# Patient Record
Sex: Male | Born: 1985 | Race: White | Hispanic: No | Marital: Single | Smoking: Current every day smoker
Health system: Southern US, Community
[De-identification: ages and names within clinical notes are randomized; demographics above are authoritative.]

## PROBLEM LIST (undated history)

## (undated) DIAGNOSIS — F419 Anxiety disorder, unspecified: Secondary | ICD-10-CM

## (undated) HISTORY — PX: HAND SURGERY: SHX662

---
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
## 2006-12-17 ENCOUNTER — Emergency Department: Payer: Self-pay | Admitting: Emergency Medicine

## 2010-03-20 ENCOUNTER — Emergency Department: Payer: Self-pay | Admitting: Emergency Medicine

## 2011-02-20 ENCOUNTER — Emergency Department: Payer: Self-pay | Admitting: Emergency Medicine

## 2011-03-18 ENCOUNTER — Emergency Department: Payer: Self-pay | Admitting: Emergency Medicine

## 2011-10-26 ENCOUNTER — Emergency Department: Payer: Self-pay | Admitting: *Deleted

## 2011-10-26 LAB — COMPREHENSIVE METABOLIC PANEL
Albumin: 4.3 g/dL (ref 3.4–5.0)
Alkaline Phosphatase: 67 U/L (ref 50–136)
BUN: 19 mg/dL — ABNORMAL HIGH (ref 7–18)
Osmolality: 291 (ref 275–301)
SGOT(AST): 27 U/L (ref 15–37)
SGPT (ALT): 28 U/L
Total Protein: 7.5 g/dL (ref 6.4–8.2)

## 2011-10-26 LAB — CBC
HCT: 46.3 % (ref 40.0–52.0)
HGB: 15.9 g/dL (ref 13.0–18.0)
MCH: 33.2 pg (ref 26.0–34.0)
Platelet: 220 10*3/uL (ref 150–440)
RBC: 4.77 10*6/uL (ref 4.40–5.90)
WBC: 7.3 10*3/uL (ref 3.8–10.6)

## 2011-10-27 LAB — URINALYSIS, COMPLETE
Bacteria: NONE SEEN
Bilirubin,UR: NEGATIVE
Blood: NEGATIVE
Glucose,UR: NEGATIVE mg/dL (ref 0–75)
Leukocyte Esterase: NEGATIVE
Nitrite: NEGATIVE
Protein: NEGATIVE
RBC,UR: <1 /HPF (ref 0–5)
Specific Gravity: 1.023 (ref 1.003–1.030)
Squamous Epithelial: NONE SEEN
WBC UR: <1 /HPF (ref 0–5)

## 2011-10-29 ENCOUNTER — Emergency Department: Payer: Self-pay | Admitting: Emergency Medicine

## 2012-06-20 ENCOUNTER — Emergency Department: Payer: Self-pay | Admitting: Emergency Medicine

## 2012-07-13 ENCOUNTER — Emergency Department: Payer: Self-pay | Admitting: Emergency Medicine

## 2012-07-13 LAB — ACETAMINOPHEN LEVEL: Acetaminophen: 16 ug/mL

## 2012-07-13 LAB — COMPREHENSIVE METABOLIC PANEL
Albumin: 4.5 g/dL (ref 3.4–5.0)
Anion Gap: 9 (ref 7–16)
Calcium, Total: 8.9 mg/dL (ref 8.5–10.1)
Chloride: 107 mmol/L (ref 98–107)
EGFR (African American): >60
Glucose: 93 mg/dL (ref 65–99)
Osmolality: 282 (ref 275–301)
Potassium: 4.1 mmol/L (ref 3.5–5.1)
SGOT(AST): 34 U/L (ref 15–37)
SGPT (ALT): 48 U/L (ref 12–78)

## 2012-07-27 ENCOUNTER — Emergency Department: Payer: Self-pay | Admitting: Emergency Medicine

## 2012-07-27 LAB — DRUG SCREEN, URINE
Barbiturates, Ur Screen: NEGATIVE (ref ?–200)
Benzodiazepine, Ur Scrn: POSITIVE (ref ?–200)
Cannabinoid 50 Ng, Ur ~~LOC~~: POSITIVE (ref ?–50)
Cocaine Metabolite,Ur ~~LOC~~: NEGATIVE (ref ?–300)
Opiate, Ur Screen: NEGATIVE (ref ?–300)
Tricyclic, Ur Screen: NEGATIVE (ref ?–1000)

## 2012-07-27 LAB — COMPREHENSIVE METABOLIC PANEL
Albumin: 4.7 g/dL (ref 3.4–5.0)
Alkaline Phosphatase: 99 U/L (ref 50–136)
Anion Gap: 11 (ref 7–16)
BUN: 30 mg/dL — ABNORMAL HIGH (ref 7–18)
Calcium, Total: 9.3 mg/dL (ref 8.5–10.1)
Chloride: 97 mmol/L — ABNORMAL LOW (ref 98–107)
Creatinine: 1.16 mg/dL (ref 0.60–1.30)
EGFR (African American): >60
EGFR (Non-African Amer.): >60
Glucose: 78 mg/dL (ref 65–99)
Osmolality: 281 (ref 275–301)
Potassium: 3.8 mmol/L (ref 3.5–5.1)
Sodium: 138 mmol/L (ref 136–145)
Total Protein: 8.5 g/dL — ABNORMAL HIGH (ref 6.4–8.2)

## 2012-07-27 LAB — CBC
HGB: 16 g/dL (ref 13.0–18.0)
MCH: 31.9 pg (ref 26.0–34.0)
RBC: 5 10*6/uL (ref 4.40–5.90)
WBC: 9.2 10*3/uL (ref 3.8–10.6)

## 2012-07-27 LAB — TSH: Thyroid Stimulating Horm: 4.57 u[IU]/mL — ABNORMAL HIGH

## 2015-03-05 ENCOUNTER — Emergency Department: Payer: Self-pay

## 2015-03-05 ENCOUNTER — Encounter: Payer: Self-pay | Admitting: Urgent Care

## 2015-03-05 ENCOUNTER — Emergency Department
Admission: EM | Admit: 2015-03-05 | Discharge: 2015-03-05 | Disposition: A | Discharge status: Home / Self Care | Payer: Self-pay | Attending: Emergency Medicine | Admitting: Emergency Medicine

## 2015-03-05 DIAGNOSIS — X58XXXA Exposure to other specified factors, initial encounter: Secondary | ICD-10-CM | POA: Insufficient documentation

## 2015-03-05 DIAGNOSIS — K029 Dental caries, unspecified: Secondary | ICD-10-CM | POA: Insufficient documentation

## 2015-03-05 DIAGNOSIS — S6000XA Contusion of unspecified finger without damage to nail, initial encounter: Secondary | ICD-10-CM

## 2015-03-05 DIAGNOSIS — Y9289 Other specified places as the place of occurrence of the external cause: Secondary | ICD-10-CM | POA: Insufficient documentation

## 2015-03-05 DIAGNOSIS — Y998 Other external cause status: Secondary | ICD-10-CM | POA: Insufficient documentation

## 2015-03-05 DIAGNOSIS — Y9389 Activity, other specified: Secondary | ICD-10-CM | POA: Insufficient documentation

## 2015-03-05 DIAGNOSIS — Z720 Tobacco use: Secondary | ICD-10-CM | POA: Insufficient documentation

## 2015-03-05 DIAGNOSIS — S60022A Contusion of left index finger without damage to nail, initial encounter: Secondary | ICD-10-CM | POA: Insufficient documentation

## 2015-03-05 HISTORY — DX: Anxiety disorder, unspecified: F41.9

## 2015-03-05 MED ORDER — LIDOCAINE VISCOUS 2 % MT SOLN
15.0000 mL | Freq: Once | OROMUCOSAL | Status: AC
  Administered 2015-03-05: 15 mL via OROMUCOSAL

## 2015-03-05 MED ORDER — IBUPROFEN 800 MG PO TABS: 800.0000 mg | ORAL_TABLET | Freq: Once | ORAL | Status: AC

## 2015-03-05 MED ORDER — OXYCODONE-ACETAMINOPHEN 5-325 MG PO TABS
ORAL_TABLET | ORAL | Status: AC
  Administered 2015-03-05: 1 via ORAL
  Filled 2015-03-05: qty 1

## 2015-03-05 MED ORDER — AMOXICILLIN 500 MG PO CAPS: 500.0000 mg | ORAL_CAPSULE | Freq: Three times a day (TID) | ORAL | Status: DC

## 2015-03-05 MED ORDER — LIDOCAINE VISCOUS 2 % MT SOLN
OROMUCOSAL | Status: AC
  Administered 2015-03-05: 15 mL via OROMUCOSAL
  Filled 2015-03-05: qty 15

## 2015-03-05 MED ORDER — OXYCODONE-ACETAMINOPHEN 5-325 MG PO TABS
1.0000 | ORAL_TABLET | Freq: Once | ORAL | Status: AC
  Administered 2015-03-05: 1 via ORAL

## 2015-03-05 MED ORDER — IBUPROFEN 800 MG PO TABS: 800.0000 mg | ORAL_TABLET | Freq: Three times a day (TID) | ORAL | Status: DC | PRN

## 2015-03-05 MED ORDER — IBUPROFEN 800 MG PO TABS
ORAL_TABLET | ORAL | Status: AC
  Filled 2015-03-05: qty 1

## 2015-03-05 MED ORDER — MAGIC MOUTHWASH W/LIDOCAINE: 5.0000 mL | Freq: Four times a day (QID) | ORAL | Status: DC

## 2015-03-05 NOTE — ED Provider Notes (Signed)
[facility] Emergency Department Provider Note ____________________________________________  Time seen: Approximately 8:36 PM  I have reviewed the triage vital signs and the nursing notes.   HISTORY  Chief Complaint Dental Pain; Hand Pain; and Weakness    HPI Paul Brooks is a 29 y.o. male patient state  Arthralgia for 3 weeks. Patient states had multiple tick bites in the past couple weeks. Today he's been having fever and chills. Patient also states she's had 3 dental abscesses. Stated x-rays confirm this when he went to the dental appointment but cannot afford to have any procedure performed secondary to lack of funds. Patient also complaining of pain to the second digit left hand secondary to picking the piece of wood 5 days ago. Patient admits to alcohol and gestation today stating he was the sterilized infection in his body . Patient rates his overall body pain as a 8/10. Denies any rash or nausea vomiting diarrhea.  Past Medical History  Diagnosis Date  . Anxiety     There are no active problems to display for this patient.   Past Surgical History  Procedure Laterality Date  . Hand surgery      Current Outpatient Rx  Name  Route  Sig  Dispense  Refill  . Alum & Mag Hydroxide-Simeth (MAGIC MOUTHWASH W/LIDOCAINE) SOLN   Oral   Take 5 mLs by mouth 4 (four) times daily.   100 mL   0   . amoxicillin (AMOXIL) 500 MG capsule   Oral   Take 1 capsule (500 mg total) by mouth 3 (three) times daily.   30 capsule   0   . ibuprofen (ADVIL,MOTRIN) 800 MG tablet   Oral   Take 1 tablet (800 mg total) by mouth every 8 (eight) hours as needed for moderate pain.   15 tablet   0     Allergies Tramadol and Doxycycline  No family history on file.  Social History History  Substance Use Topics  . Smoking status: Current Every Day Smoker  . Smokeless tobacco: Not on file  . Alcohol Use: Yes    Review of Systems Constitutional: No fever/chills.  States myalgia and arthralgia Eyes: No visual changes. ENT: No sore throat. States dental pain Cardiovascular: Denies chest pain. Respiratory: Denies shortness of breath. Gastrointestinal: No abdominal pain.  No nausea, no vomiting.  No diarrhea.  No constipation. Genitourinary: Negative for dysuria. Musculoskeletal: Negative for back pain. Skin: Negative for rash. Neurological: Negative for headaches, focal weakness or numbness. } 10-point ROS otherwise negative.  ____________________________________________   PHYSICAL EXAM:  VITAL SIGNS: ED Triage Vitals  Enc Vitals Group     BP 03/05/15 1941 122/81 mmHg     Pulse Rate 03/05/15 1941 86     Resp 03/05/15 1941 16     Temp 03/05/15 1941 97.7 F (36.5 C)     Temp Source 03/05/15 1941 Oral     SpO2 03/05/15 1941 100 %     Weight 03/05/15 1941 156 lb (70.761 kg)     Height 03/05/15 1941 5\' 5"  (1.651 m)     Head Cir --      Peak Flow --      Pain Score 03/05/15 1941 8     Pain Loc --      Pain Edu? --      Excl. in GC? --    Constitutional: Alert and oriented. Appears malaise however could be secondary to alcohol use today. Eyes: Conjunctivae are normal. PERRL. EOMI. Head:  Atraumatic. Nose: No congestion/rhinnorhea. Mouth/Throat: Mucous membranes are moist.  Oropharynx non-erythematous. Multiple dental caries in the right lower molars. Neck: No stridor.  No spinal deformity no guarding with palpation full and equal range of motion. Hematological/Lymphatic/Immunilogical: Right cervical lymphadenopathy. Cardiovascular: Normal rate, regular rhythm. Grossly normal heart sounds.  Good peripheral circulation. Respiratory: Normal respiratory effort.  No retractions. Lungs CTAB. Gastrointestinal: Soft and nontender. No distention. No abdominal bruits. No CVA tenderness. Musculoskeletal: He edema to the second digit left hand. No joint effusions. Neurologic:  Normal speech and language. No gross focal neurologic deficits are  appreciated. Speech is normal. No gait instability. Skin:  Skin is warm, dry and intact. No rash noted. Psychiatric: Mood and affect are normal. Speech and behavior are normal.  ____________________________________________   LABS (all labs ordered are listed, but only abnormal results are displayed)  Labs Reviewed - No data to display ____________________________________________  EKG   ____________________________________________  RADIOLOGY  No acute findings x-ray of the second digit left hand. ____________________________________________   PROCEDURES  Procedure(s) performed: None  Critical Care performed: No  ____________________________________________   INITIAL IMPRESSION / ASSESSMENT AND PLAN / ED COURSE  Pertinent labs & imaging results that were available during my care of the patient were reviewed by me and considered in my medical decision making (see chart for details).  Myalgia, dental pain, and left second digit contusion ____________________________________________   FINAL CLINICAL IMPRESSION(S) / ED DIAGNOSES  Final diagnoses:  Pain due to dental caries  Finger contusion, initial encounter      Joni Reining, PA-C 03/05/15 2052  Loleta Rose, MD 03/05/15 [phone]

## 2015-03-05 NOTE — ED Notes (Addendum)
Patient presents with multiple c/o. Patient states, "I think I have tick fever. I have felt bad for over 3 weeks." Also reporting 3 dental abscesses - "they did xrays, but I couldn't have anything done." Patient also with pain in the 2nd digit of his LEFT hand - reports that he hit with wood x 5 days ago. Patient took EMS in - ETOH odor appreciated by RN in triage - states, "I am not a drinker, but my family told me to drink some alcohol and it would sterilize my body from all the infection that I think that I have."

## 2015-03-05 NOTE — Discharge Instructions (Signed)
OPTIONS FOR DENTAL FOLLOW UP CARE ° °[place] [facility] and [facility] - Local Safety Net Dental Clinics °http://www.ncdhhs.gov/dph/oralhealth/services/safetynetclinics.htm °  °[facility] (336-562-3123) ° °[facility] [place] (919-933-9087) ° °[facility] ([phone] ext [phone]) ° °[facility] ([phone]) ° °[facility] ([phone]) °This clinic caters to the indigent population and is on a lottery system. °Location: °[facility], [address], [place] °Clinic Hours: °Wednesdays from 6pm - 9pm, patients seen by a lottery system. °For dates, call or go to [email or web address]/[facility]/patients/Dental-[facility] °Services: °Cleanings, fillings and simple extractions. °Payment Options: °DENTAL WORK IS FREE OF CHARGE. Bring proof of income or support. °Best way to get seen: °Arrive at 5:15 pm - this is a lottery, NOT first come/first serve, so arriving earlier will not increase your chances of being seen. °  °  °[facility] °[phone] °Select option 1 for emergencies °  °Location: °[facility], [address], [place] °Clinic Hours: °No walk-ins accepted - call the day before to schedule an appointment. °Check in times are 9:30 am and 1:30 pm. °Services: °Simple extractions, temporary fillings, pulpectomy/pulp debridement, uncomplicated abscess drainage. °Payment Options: °PAYMENT IS DUE AT THE TIME OF SERVICE.  Fee is usually $100-200, additional surgical procedures (e.g. abscess drainage) may be extra. °Cash, checks, Visa/MasterCard accepted.  Can file Medicaid if patient is covered for dental - patient should call case worker to check. °No discount for [facility] patients. °Best way to get seen: °MUST call the day before and get onto the schedule. Can usually be seen the next 1-2 days. No walk-ins accepted. °  °  °[facility] °[phone] °   °Location: °[facility], [address], [place] °Clinic Hours: °M, W, Th, F 8am or 1:30pm, Tues 9a or 1:30 - first come/first served. °Services: °Simple extractions, temporary fillings, uncomplicated abscess drainage.  You do not need to be an [place] resident. °Payment Options: °PAYMENT IS DUE AT THE TIME OF SERVICE. °Dental insurance, otherwise sliding scale - bring proof of income or support. °Depending on income and treatment needed, cost is usually $50-200. °Best way to get seen: °Arrive early as it is first come/first served. °  °  °[facility] °[phone] °  °Location: °[address] °Clinic Hours: °Mon-Thu 8a-5p °Services: °Most basic dental services including extractions and fillings. °Payment Options: °PAYMENT IS DUE AT THE TIME OF SERVICE. °Sliding scale, up to 50% off - bring proof if income or support. °Medicaid with dental option accepted. °Best way to get seen: °Call to schedule an appointment, can usually be seen within 2 weeks OR they will try to see walk-ins - show up at 8a or 2p (you may have to wait). °  °  °[facility] °919-245-2435 °[place] RESIDENTS ONLY °  °Location: °[facility], [address], [place],  [address] °Clinic Hours: By appointment only. °Monday - Thursday 8am-5pm, Friday 8am-12pm °Services: Cleanings, fillings, extractions. °Payment Options: °PAYMENT IS DUE AT THE TIME OF SERVICE. °Cash, Visa or MasterCard. Sliding scale - $30 minimum per service. °Best way to get seen: °Come in to office, complete packet and make an appointment - need proof of income °or support monies for each household member and proof of [place] residence. °Usually takes about a month to get in. °  °  °[facility] °[phone] °  °Location: °[address]   [place] °Clinic Hours: Walk-in Urgent Care Dental Services are offered Monday-Friday  mornings only. °The numbers of emergencies accepted daily is limited to the number of °providers available. °Maximum 15 - Mondays, Wednesdays & Thursdays °Maximum 10 - Tuesdays & Fridays °Services: °You do not need to be a [place] resident to be seen for a dental emergency. °Emergencies are defined as pain, swelling, abnormal bleeding, or dental trauma. Walkins will receive x-rays if needed. °NOTE: Dental cleaning is not an emergency. °Payment Options: °PAYMENT IS DUE AT THE TIME OF SERVICE. °Minimum co-pay is $40.00 for uninsured patients. °Minimum co-pay is $3.00 for Medicaid with dental coverage. °Dental Insurance is accepted and must be presented at time of visit. °Medicare does not cover dental. °Forms of payment: Cash, credit card, checks. °Best way to get seen: °If not previously registered with the clinic, walk-in dental registration begins at 7:15 am and is on a first come/first serve basis. °If previously registered with the clinic, call to make an appointment. °  °  °[facility] °[phone] °[place] RESIDENTS ONLY °  °Location: °[address], [place], [place] °Clinic Hours: °Mon-Thu 10a-2p °Services: Extractions only! °Payment Options: °FREE (donations accepted) - bring proof of income or support °Best way to get seen: °Call and schedule an appointment OR come at 8am on the 1st Monday of every month (except for holidays) when it is first come/first served. °  °  °[facility] °[phone] °  °Location: °[address], [place] °Clinic Hours: °Friday mornings °Services, Payment Options, Best way to get seen: °Call for info °

## 2015-07-11 ENCOUNTER — Emergency Department
Admission: EM | Admit: 2015-07-11 | Discharge: 2015-07-11 | Disposition: A | Discharge status: Home / Self Care | Payer: Self-pay | Attending: Emergency Medicine | Admitting: Emergency Medicine

## 2015-07-11 ENCOUNTER — Emergency Department: Payer: Self-pay

## 2015-07-11 ENCOUNTER — Encounter: Payer: Self-pay | Admitting: Emergency Medicine

## 2015-07-11 DIAGNOSIS — N508 Other specified disorders of male genital organs: Secondary | ICD-10-CM | POA: Insufficient documentation

## 2015-07-11 DIAGNOSIS — R1032 Left lower quadrant pain: Secondary | ICD-10-CM

## 2015-07-11 DIAGNOSIS — Z792 Long term (current) use of antibiotics: Secondary | ICD-10-CM | POA: Insufficient documentation

## 2015-07-11 DIAGNOSIS — L255 Unspecified contact dermatitis due to plants, except food: Secondary | ICD-10-CM | POA: Insufficient documentation

## 2015-07-11 DIAGNOSIS — Z720 Tobacco use: Secondary | ICD-10-CM | POA: Insufficient documentation

## 2015-07-11 DIAGNOSIS — N50811 Right testicular pain: Secondary | ICD-10-CM

## 2015-07-11 DIAGNOSIS — Z79899 Other long term (current) drug therapy: Secondary | ICD-10-CM | POA: Insufficient documentation

## 2015-07-11 DIAGNOSIS — R609 Edema, unspecified: Secondary | ICD-10-CM

## 2015-07-11 LAB — COMPREHENSIVE METABOLIC PANEL
ALK PHOS: 70 U/L (ref 38–126)
ALT: 31 U/L (ref 17–63)
AST: 23 U/L (ref 15–41)
Albumin: 4.6 g/dL (ref 3.5–5.0)
Anion gap: 5 (ref 5–15)
BUN: 11 mg/dL (ref 6–20)
CO2: 27 mmol/L (ref 22–32)
CREATININE: 1.04 mg/dL (ref 0.61–1.24)
Calcium: 8.9 mg/dL (ref 8.9–10.3)
Chloride: 104 mmol/L (ref 101–111)
GFR calc Af Amer: >60 mL/min (ref >60)
GFR calc non Af Amer: >60 mL/min (ref >60)
Glucose, Bld: 87 mg/dL (ref 65–99)
Potassium: 3.8 mmol/L (ref 3.5–5.1)
SODIUM: 136 mmol/L (ref 135–145)
Total Bilirubin: 0.4 mg/dL (ref 0.3–1.2)
Total Protein: 7.1 g/dL (ref 6.5–8.1)

## 2015-07-11 LAB — CBC WITH DIFFERENTIAL/PLATELET
Basophils Absolute: 0.1 10*3/uL (ref 0–0.1)
Basophils Relative: 1 %
Eosinophils Absolute: 0.2 10*3/uL (ref 0–0.7)
Eosinophils Relative: 3 %
HCT: 44.2 % (ref 40.0–52.0)
HEMOGLOBIN: 15 g/dL (ref 13.0–18.0)
LYMPHS ABS: 2.6 10*3/uL (ref 1.0–3.6)
LYMPHS PCT: 29 %
MCH: 31.4 pg (ref 26.0–34.0)
MCHC: 33.9 g/dL (ref 32.0–36.0)
MCV: 92.4 fL (ref 80.0–100.0)
Monocytes Absolute: 0.4 10*3/uL (ref 0.2–1.0)
Monocytes Relative: 5 %
NEUTROS ABS: 5.6 10*3/uL (ref 1.4–6.5)
NEUTROS PCT: 62 %
Platelets: 202 10*3/uL (ref 150–440)
RBC: 4.78 MIL/uL (ref 4.40–5.90)
RDW: 13.5 % (ref 11.5–14.5)
WBC: 8.9 10*3/uL (ref 3.8–10.6)

## 2015-07-11 LAB — LIPASE, BLOOD: Lipase: 29 U/L (ref 22–51)

## 2015-07-11 MED ORDER — CEFTRIAXONE SODIUM 250 MG IJ SOLR: 250.0000 mg | Freq: Once | INTRAMUSCULAR | Status: DC

## 2015-07-11 MED ORDER — SODIUM CHLORIDE 0.9 % IV BOLUS (SEPSIS)
1000.0000 mL | INTRAVENOUS | Status: AC
  Administered 2015-07-11: 1000 mL via INTRAVENOUS

## 2015-07-11 MED ORDER — CEFTRIAXONE SODIUM 250 MG IJ SOLR
INTRAMUSCULAR | Status: AC
  Filled 2015-07-11: qty 250

## 2015-07-11 MED ORDER — IOHEXOL 240 MG/ML SOLN
25.0000 mL | Freq: Once | INTRAMUSCULAR | Status: AC | PRN
  Administered 2015-07-11: 25 mL via ORAL
  Filled 2015-07-11: qty 25

## 2015-07-11 MED ORDER — PREDNISONE 5 MG PO TABS: ORAL_TABLET | ORAL | Status: DC

## 2015-07-11 MED ORDER — ONDANSETRON HCL 4 MG/2ML IJ SOLN
4.0000 mg | Freq: Once | INTRAMUSCULAR | Status: AC
  Administered 2015-07-11: 4 mg via INTRAVENOUS

## 2015-07-11 MED ORDER — MORPHINE SULFATE (PF) 4 MG/ML IV SOLN
4.0000 mg | Freq: Once | INTRAVENOUS | Status: AC
Start: 2015-07-11 — End: 2015-07-11
  Administered 2015-07-11: 4 mg via INTRAVENOUS

## 2015-07-11 MED ORDER — DOXYCYCLINE HYCLATE 100 MG PO CAPS: ORAL_CAPSULE | ORAL | Status: DC

## 2015-07-11 MED ORDER — IOHEXOL 300 MG/ML  SOLN
100.0000 mL | Freq: Once | INTRAMUSCULAR | Status: AC | PRN
  Administered 2015-07-11: 100 mL via INTRAVENOUS
  Filled 2015-07-11: qty 100

## 2015-07-11 NOTE — ED Notes (Signed)
Brought in via ems for pain to testicles  Right is more swollen

## 2015-07-11 NOTE — ED Provider Notes (Signed)
[facility] Emergency Department Provider Note  ____________________________________________  Time seen: Approximately 2:09 PM  I have reviewed the triage vital signs and the nursing notes.   HISTORY  Chief Complaint Testicle Pain    HPI Paul Brooks is a 29 y.o. male with a history of anxiety who presents with bilateral testicular pain and LLQ abdominal pain, worsening over about 10 days.  He states that it started gradually but within about 3 days worsened to severe pain in his lower abdomen and bilateral testicles although it is worse in the left lower quadrant of his abdomen.  It is accompanied by multiple episodes of vomiting and frequent episodes of nausea.  He states the pain is sharp and severe.  Movement makes it worse and nothing makes it better.  He denies fever/chills, chest pain, shortness of breath.  Denies any penile discharge or dysuria.  He states that he is not currently sexually active.He denies any trauma to the abdomen or genital region.  He also complains of a rash associated with poison ivy or poison sumac which he acquired "a couple of days ago".  The rashes on his hands, arms, and on the right side of his face.  It is itching and he has excoriated several lesions.   Past Medical History  Diagnosis Date  . Anxiety     There are no active problems to display for this patient.   Past Surgical History  Procedure Laterality Date  . Hand surgery      Current Outpatient Rx  Name  Route  Sig  Dispense  Refill  . Alum & Mag Hydroxide-Simeth (MAGIC MOUTHWASH W/LIDOCAINE) SOLN   Oral   Take 5 mLs by mouth 4 (four) times daily.   100 mL   0   . amoxicillin (AMOXIL) 500 MG capsule   Oral   Take 1 capsule (500 mg total) by mouth 3 (three) times daily.   30 capsule   0   . doxycycline (VIBRAMYCIN) 100 MG capsule      Take 1 capsule (100 mg) by mouth twice daily for 10 days.   20 capsule   0   . ibuprofen (ADVIL,MOTRIN) 800 MG  tablet   Oral   Take 1 tablet (800 mg total) by mouth every 8 (eight) hours as needed for moderate pain.   15 tablet   0   . predniSONE (DELTASONE) 5 MG tablet      Take 12 tabs (60 mg) PO once daily on days 1-3, 8 tabs (40 mg) daily on days 4-6, 6 tabs (30 mg) daily on days 7-9, 5 tabs (25 mg) daily on days 10-12, 4 tabs (20 mg) daily on days 13-15, 3 tabs (15 mg) daily on days 16-18, 2 tabs (10 mg) daily on days 19-21, 1 tab (5 mg) daily days 22-24.   123 tablet   0     Allergies Tramadol and Doxycycline  No family history on file.  Social History Social History  Substance Use Topics  . Smoking status: Current Every Day Smoker  . Smokeless tobacco: None  . Alcohol Use: Yes    Review of Systems Constitutional: No fever/chills Eyes: No visual changes. ENT: No sore throat. Cardiovascular: Denies chest pain. Respiratory: Denies shortness of breath. Gastrointestinal: Severe left lower quadrant and generalized lower abdominal pain, sharp, associated with nausea and vomiting . Genitourinary: Negative for dysuria.  Pain in both testicles, worse on the right Musculoskeletal: Negative for back pain. Skin: Rash on his arms,  hands, and face associated with poison ivy Neurological: Negative for headaches, focal weakness or numbness.  10-point ROS otherwise negative.  ____________________________________________   PHYSICAL EXAM:  VITAL SIGNS: ED Triage Vitals  Enc Vitals Group     BP 07/11/15 1228 113/68 mmHg     Pulse Rate 07/11/15 1228 78     Resp 07/11/15 1228 20     Temp 07/11/15 1228 98.3 F (36.8 C)     Temp Source 07/11/15 1228 Oral     SpO2 07/11/15 1228 99 %     Weight 07/11/15 1228 145 lb (65.772 kg)     Height 07/11/15 1228  (1.651 m)     Head Cir --      Peak Flow --      Pain Score 07/11/15 1229 9     Pain Loc --      Pain Edu? --      Excl. in GC? --     Constitutional: Alert and oriented.  Appears uncomfortable but is not in acute  distress. Eyes: Conjunctivae are normal. PERRL. EOMI. I do not appreciate any involvement of his eyes with a rash. Head: Atraumatic. Nose: No congestion/rhinnorhea. Mouth/Throat: Mucous membranes are moist.  Oropharynx non-erythematous. Neck: No stridor.   Cardiovascular: Normal rate, regular rhythm. Grossly normal heart sounds.  Good peripheral circulation. Respiratory: Normal respiratory effort.  No retractions. Lungs CTAB. Gastrointestinal: Normal bowel sounds, soft but severe tenderness in left lower quadrant with rebound and guarding.  No pulsatile masses in the abdomen. Genitourinary: Normal-appearing external male circumcised genitalia.  Normal-appearing testicles.  No palpable masses.  Epididymitis on the right is tender to palpation but not significantly swollen.  There is no penile discharge. Musculoskeletal: No lower extremity tenderness nor edema.  No joint effusions. Neurologic:  Normal speech and language. No gross focal neurologic deficits are appreciated.  Skin:  Skin is warm, dry and intact.  Patient has what appears to be contact dermatitis consistent with his history of present illness (plant contact) primarily on his arms and a small amount on the right side of his face as well.  There is no evidence of arterial superinfection though there are several excoriated lesions.   ____________________________________________   LABS (all labs ordered are listed, but only abnormal results are displayed)  Labs Reviewed  CHLAMYDIA/NGC RT PCR ([facility] ONLY)  CBC WITH DIFFERENTIAL/PLATELET  COMPREHENSIVE METABOLIC PANEL  LIPASE, BLOOD   ____________________________________________  EKG  Not indicated ____________________________________________  RADIOLOGY   US Scrotum  07/11/2015   CLINICAL DATA:  Two week history of right scrotal region swelling. Palpable fullness bilaterally  EXAM: SCROTAL ULTRASOUND  DOPPLER ULTRASOUND OF THE TESTICLES  TECHNIQUE: Complete  ultrasound examination of the testicles, epididymis, and other scrotal structures was performed. Color and spectral Doppler ultrasound were also utilized to evaluate blood flow to the testicles.  COMPARISON:  None.  FINDINGS: Right testicle  Measurements: 3.6 x 2.5 x 4.7 cm. No mass or microlithiasis visualized.  Left testicle  Measurements: 4.2 x 2.6 x 3.6 cm. No mass or microlithiasis visualized.  Right epididymis:  Normal in size and appearance.  Left epididymis:  Normal in size and appearance.  Hydrocele:  There is a small hydrocele on each side.  Varicocele:  None visualized.  Pulsed Doppler interrogation of both testes demonstrates normal low resistance arterial and venous waveforms bilaterally. The peak systolic velocity in the right testis is 7.2 centimeters/second with an end-diastolic velocity of 3.2 centimeter/second. The peak systolic velocity of the left testis is  6.0 centimeter/second with end-diastolic velocity of 3.5 centimeter/second.  There is no scrotal wall thickening or abscess on either side. No lesions are identified by ultrasound in either inguinal canal region.  IMPRESSION: Rather minimal hydroceles bilaterally. No intratesticular or extratesticular mass on either side. No evidence of testicular torsion on either side. No inflammatory foci are identified on either side.   Electronically Signed   By: Bretta Bang III M.D.   On: 07/11/2015 14:34   Ct Abdomen Pelvis W Contrast  07/11/2015   CLINICAL DATA:  Bilateral testicular pain.  No known injury.  EXAM: CT ABDOMEN AND PELVIS WITH CONTRAST  TECHNIQUE: Multidetector CT imaging of the abdomen and pelvis was performed using the standard protocol following bolus administration of intravenous contrast.  CONTRAST:  OMNIPAQUE IOHEXOL 300 MG/ML  SOLN  COMPARISON:  Scrotal ultrasound, same date  FINDINGS: Lower chest: The lung bases are clear of acute process. No pleural effusion or pulmonary lesions. The heart is normal in size. No  pericardial effusion. The distal esophagus and aorta are unremarkable.  Hepatobiliary: No focal hepatic lesions or intrahepatic biliary dilatation. The gallbladder is normal. No common bile duct dilatation.  Pancreas: No mass, inflammation or ductal dilatation  Spleen: Normal size.  No focal lesions.  Adrenals/Urinary Tract: The adrenal glands are normal.  Both kidneys are normal. No renal or obstructing ureteral calculi or bladder calculi. Both kidneys demonstrate normal enhancement/ perfusion. No bladder wall thickening or bladder mass.  Stomach/Bowel: The stomach, duodenum, small bowel and colon are unremarkable. No inflammatory changes, mass lesions or obstructive findings. The terminal ileum is normal. The appendix is normal. Moderate stool throughout the colon. Scattered colonic diverticulosis but no findings for acute diverticulitis.  Vascular/Lymphatic: No mesenteric or retroperitoneal mass or adenopathy. Small scattered lymph nodes are noted. The aorta and branch vessels are normal. The major venous structures are patent.  Other: The bladder, prostate gland and seminal vesicles are normal. No pelvic mass or adenopathy. No free pelvic fluid collections. No inguinal mass, adenopathy or hernia. A right scrotal calcifications noted near the right testicle which is probably a benign scrotal pearl.  Musculoskeletal: No significant bony findings.  IMPRESSION: 1. No acute abdominal/pelvic findings, mass lesions or adenopathy. 2. No renal or obstructing ureteral calculi or bladder calculi. 3. Small calcification in the right hemiscrotum, likely benign scrotal pearl. 4. No inguinal mass or hernia.   Electronically Signed   By: Rudie Meyer M.D.   On: 07/11/2015 16:25   [place] Art/ven Flow Abd Pelv Doppler  07/11/2015   CLINICAL DATA:  Two week history of right scrotal region swelling. Palpable fullness bilaterally  EXAM: SCROTAL ULTRASOUND  DOPPLER ULTRASOUND OF THE TESTICLES  TECHNIQUE: Complete ultrasound  examination of the testicles, epididymis, and other scrotal structures was performed. Color and spectral Doppler ultrasound were also utilized to evaluate blood flow to the testicles.  COMPARISON:  None.  FINDINGS: Right testicle  Measurements: 3.6 x 2.5 x 4.7 cm. No mass or microlithiasis visualized.  Left testicle  Measurements: 4.2 x 2.6 x 3.6 cm. No mass or microlithiasis visualized.  Right epididymis:  Normal in size and appearance.  Left epididymis:  Normal in size and appearance.  Hydrocele:  There is a small hydrocele on each side.  Varicocele:  None visualized.  Pulsed Doppler interrogation of both testes demonstrates normal low resistance arterial and venous waveforms bilaterally. The peak systolic velocity in the right testis is 7.2 centimeters/second with an end-diastolic velocity of 3.2 centimeter/second. The peak systolic velocity  of the left testis is 6.0 centimeter/second with end-diastolic velocity of 3.5 centimeter/second.  There is no scrotal wall thickening or abscess on either side. No lesions are identified by ultrasound in either inguinal canal region.  IMPRESSION: Rather minimal hydroceles bilaterally. No intratesticular or extratesticular mass on either side. No evidence of testicular torsion on either side. No inflammatory foci are identified on either side.   Electronically Signed   By: Bretta Bang III M.D.   On: 07/11/2015 14:34    ____________________________________________   PROCEDURES  Procedure(s) performed: None  Critical Care performed: No ____________________________________________   INITIAL IMPRESSION / ASSESSMENT AND PLAN / ED COURSE  Pertinent labs & imaging results that were available during my care of the patient were reviewed by me and considered in my medical decision making (see chart for details).  The patient is generally well-appearing with normal vital signs but he is complaining of severe tenderness to palpation of the left lower quadrant.   His scrotal ultrasounds are reassuring as is his genital physical exam.  Given the tenderness to palpation, I will obtain a CT scan of his abdomen and pelvis with by mouth and IV contrast and check regular labs.  Also check a urinalysis and a urine GC/chlamydia.  I anticipate empiric treatment for epididymitis if the patient's CT scan is normal.  ----------------------------------------- 4:55 PM on 07/11/2015 -----------------------------------------  I discussed the reassuring results of all the patient's workup with him.  He did not or would not provide a urine sample during his ED visit.  When I explained to him that I encouraged him to take over-the-counter ibuprofen and Tylenol and take the doxycycline for presumed epididymitis, he became quite displeased with me and several times requested narcotics.  I explained I was not comfortable doing so, and he left the emergency department without discharge instructions.  His mother returned shortly and asked for more information including treatment for his contact dermatitis.  I provided a long prednisone taper which is recommended for poison ivy/oak/sumac and encouraged the use of Benadryl.  I reiterated to her how important it is that he take his doxycycline.  She states that she understands.  I gave her my usual and customary return precautions. ____________________________________________  FINAL CLINICAL IMPRESSION(S) / ED DIAGNOSES  Final diagnoses:  Testicular pain, right  Left lower quadrant pain  Contact dermatitis due to plant      NEW MEDICATIONS STARTED DURING THIS VISIT:  Discharge Medication List as of 07/11/2015  5:07 PM    START taking these medications   Details  doxycycline (VIBRAMYCIN) 100 MG capsule Take 1 capsule (100 mg) by mouth twice daily for 10 days., Print    predniSONE (DELTASONE) 5 MG tablet Take 12 tabs (60 mg) PO once daily on days 1-3, 8 tabs (40 mg) daily on days 4-6, 6 tabs (30 mg) daily on days 7-9, 5 tabs  (25 mg) daily on days 10-12, 4 tabs (20 mg) daily on days 13-15, 3 tabs (15 mg) daily on days 16-18, 2 tabs (10 mg) daily on days 1 9-21, 1 tab (5 mg) daily days 22-24., Print         Loleta Rose, MD 07/11/15 2138

## 2015-07-11 NOTE — ED Notes (Signed)
MD York Cerise addressed patient about reassuring labs and exams. MD informed patient that a narcotic pain medication would not be administered in the absence of an abnormal study and for the patient to wait in the room for his discharge paperwork. Patient became quite angry and left prior to official discharge. Patient stated that his grandfather died of testicle complications and "if he dies" we [the hospital] will be hearing from his attorney. Patient left at a rapid walking pace on the phone. Patient's family is currently at the RN desk speaking with the MD about the Hospital's lack of concern regarding his "Poison Sumac". MD agreed to a medication course addressing that.

## 2015-07-11 NOTE — Discharge Instructions (Signed)
You have been seen in the Emergency Department (ED) for abdominal pain.  Your evaluation did not identify a clear cause of your symptoms but was generally reassuring.  As we discussed, we are going to treat you for presumed epididymitis given the description of her symptoms - please take the doxycycline prescribed as written.  It is important that you follow-up with the urologist listed if your symptoms do not improve in about 1 week after taking the antibiotics.  Please follow up as instructed above regarding todays emergent visit and the symptoms that are bothering you.  Return to the ED if your abdominal pain worsens or fails to improve, you develop bloody vomiting, bloody diarrhea, you are unable to tolerate fluids due to vomiting, fever greater than 101, or other symptoms that concern you.  Additionally, we gave you a long, 24 day course of a steroid taper that should help with the skin rash you got from contact with a poisonous plant.  Take Benadryl as needed as well for the itching.  Follow-up as needed for new or worsening symptoms that concern you.  Abdominal Pain Many things can cause abdominal pain. Usually, abdominal pain is not caused by a disease and will improve without treatment. It can often be observed and treated at home. Your health care provider will do a physical exam and possibly order blood tests and X-rays to help determine the seriousness of your pain. However, in many cases, more time must pass before a clear cause of the pain can be found. Before that point, your health care provider may not know if you need more testing or further treatment. HOME CARE INSTRUCTIONS  Monitor your abdominal pain for any changes. The following actions may help to alleviate any discomfort you are experiencing:  Only take over-the-counter or prescription medicines as directed by your health care provider.  Do not take laxatives unless directed to do so by your health care provider.  Try a  clear liquid diet (broth, tea, or water) as directed by your health care provider. Slowly move to a bland diet as tolerated. SEEK MEDICAL CARE IF:  You have unexplained abdominal pain.  You have abdominal pain associated with nausea or diarrhea.  You have pain when you urinate or have a bowel movement.  You experience abdominal pain that wakes you in the night.  You have abdominal pain that is worsened or improved by eating food.  You have abdominal pain that is worsened with eating fatty foods.  You have a fever. SEEK IMMEDIATE MEDICAL CARE IF:   Your pain does not go away within 2 hours.  You keep throwing up (vomiting).  Your pain is felt only in portions of the abdomen, such as the right side or the left lower portion of the abdomen.  You pass bloody or black tarry stools. MAKE SURE YOU:  Understand these instructions.   Will watch your condition.   Will get help right away if you are not doing well or get worse.  Document Released: 07/08/2005 Document Revised: 10/03/2013 Document Reviewed: 06/07/2013 [facility] Patient Information 2015 [place], [place]. This information is not intended to replace advice given to you by your health care provider. Make sure you discuss any questions you have with your health care provider.

## 2015-07-11 NOTE — ED Notes (Signed)
Pt presents with testicle pain and groin pain for about two weeks. Also states has a bad rash from poison oak.

## 2015-07-13 NOTE — ED Notes (Signed)
Returned call to mother "Paul Brooks" Reguarding prescription for Doxy. Patient is allergic to doxy. Dr. Lenard Lance prescribed  Cipro bid 10 days. This RN will call the prescription in to [facility] [phone]. Patient's mother expressed concern over her son not receiving a narcotic pain medicine. This RN explained narcotic presciptions are the discretion of the Paul Brooks and are tightly regulated. Mother then became irate demanding to know why his testicles are lumpy.

## 2015-11-27 ENCOUNTER — Encounter: Payer: Self-pay | Admitting: *Deleted

## 2015-11-27 ENCOUNTER — Emergency Department: Payer: Self-pay

## 2015-11-27 ENCOUNTER — Emergency Department
Admission: EM | Admit: 2015-11-27 | Discharge: 2015-11-27 | Disposition: A | Discharge status: Home / Self Care | Payer: Self-pay | Attending: Emergency Medicine | Admitting: Emergency Medicine

## 2015-11-27 DIAGNOSIS — Z79899 Other long term (current) drug therapy: Secondary | ICD-10-CM | POA: Insufficient documentation

## 2015-11-27 DIAGNOSIS — Y998 Other external cause status: Secondary | ICD-10-CM | POA: Insufficient documentation

## 2015-11-27 DIAGNOSIS — Y9389 Activity, other specified: Secondary | ICD-10-CM | POA: Insufficient documentation

## 2015-11-27 DIAGNOSIS — K0889 Other specified disorders of teeth and supporting structures: Secondary | ICD-10-CM | POA: Insufficient documentation

## 2015-11-27 DIAGNOSIS — S60211A Contusion of right wrist, initial encounter: Secondary | ICD-10-CM | POA: Insufficient documentation

## 2015-11-27 DIAGNOSIS — K029 Dental caries, unspecified: Secondary | ICD-10-CM | POA: Insufficient documentation

## 2015-11-27 DIAGNOSIS — F17200 Nicotine dependence, unspecified, uncomplicated: Secondary | ICD-10-CM | POA: Insufficient documentation

## 2015-11-27 DIAGNOSIS — Y9289 Other specified places as the place of occurrence of the external cause: Secondary | ICD-10-CM | POA: Insufficient documentation

## 2015-11-27 MED ORDER — IBUPROFEN 600 MG PO TABS: 600.0000 mg | ORAL_TABLET | Freq: Three times a day (TID) | ORAL | Status: DC | PRN

## 2015-11-27 MED ORDER — AMOXICILLIN 500 MG PO CAPS: 500.0000 mg | ORAL_CAPSULE | Freq: Three times a day (TID) | ORAL | Status: DC

## 2015-11-27 MED ORDER — OXYCODONE-ACETAMINOPHEN 5-325 MG PO TABS: 1.0000 | ORAL_TABLET | ORAL | Status: DC | PRN

## 2015-11-27 MED ORDER — HYDROCODONE-ACETAMINOPHEN 5-325 MG PO TABS
1.0000 | ORAL_TABLET | Freq: Once | ORAL | Status: AC
  Administered 2015-11-27: 1 via ORAL
  Filled 2015-11-27: qty 1

## 2015-11-27 NOTE — ED Notes (Signed)
Pt complains of dental pain and right wrist pain from playing the guitar

## 2015-11-27 NOTE — Discharge Instructions (Signed)
Dental Caries °Dental caries (also called tooth decay) is the most common oral disease. It can occur at any age but is more common in children and young adults.  °HOW DENTAL CARIES DEVELOPS  °The process of decay begins when bacteria and foods (particularly sugars and starches) combine in your mouth to produce plaque. Plaque is a substance that sticks to the hard, outer surface of a tooth (enamel). The bacteria in plaque produce acids that attack enamel. These acids may also attack the root surface of a tooth (cementum) if it is exposed. Repeated attacks dissolve these surfaces and create holes in the tooth (cavities). If left untreated, the acids destroy the other layers of the tooth.  °RISK FACTORS °· Frequent sipping of sugary beverages.   °· Frequent snacking on sugary and starchy foods, especially those that easily get stuck in the teeth.   °· Poor oral hygiene.   °· Dry mouth.   °· Substance abuse such as methamphetamine abuse.   °· Broken or poor-fitting dental restorations.   °· Eating disorders.   °· Gastroesophageal reflux disease (GERD).   °· Certain radiation treatments to the head and neck. °SYMPTOMS °In the early stages of dental caries, symptoms are seldom present. Sometimes white, chalky areas may be seen on the enamel or other tooth layers. In later stages, symptoms may include: °· Pits and holes on the enamel. °· Toothache after sweet, hot, or cold foods or drinks are consumed. °· Pain around the tooth. °· Swelling around the tooth. °DIAGNOSIS  °Most of the time, dental caries is detected during a regular dental checkup. A diagnosis is made after a thorough medical and dental history is taken and the surfaces of your teeth are checked for signs of dental caries. Sometimes special instruments, such as lasers, are used to check for dental caries. Dental X-ray exams may be taken so that areas not visible to the eye (such as between the contact areas of the teeth) can be checked for cavities.    °TREATMENT  °If dental caries is in its early stages, it may be reversed with a fluoride treatment or an application of a remineralizing agent at the dental office. Thorough brushing and flossing at home is needed to aid these treatments. If it is in its later stages, treatment depends on the location and extent of tooth destruction:  °· If a small area of the tooth has been destroyed, the destroyed area will be removed and cavities will be filled with a material such as gold, silver amalgam, or composite resin.   °· If a large area of the tooth has been destroyed, the destroyed area will be removed and a cap (crown) will be fitted over the remaining tooth structure.   °· If the center part of the tooth (pulp) is affected, a procedure called a root canal will be needed before a filling or crown can be placed.   °· If most of the tooth has been destroyed, the tooth may need to be pulled (extracted). °HOME CARE INSTRUCTIONS °You can prevent, stop, or reverse dental caries at home by practicing good oral hygiene. Good oral hygiene includes: °· Thoroughly cleaning your teeth at least twice a day with a toothbrush and dental floss.   °· Using a fluoride toothpaste. A fluoride mouth rinse may also be used if recommended by your dentist or health care provider.   °· Restricting the amount of sugary and starchy foods and sugary liquids you consume.   °· Avoiding frequent snacking on these foods and sipping of these liquids.   °· Keeping regular visits with   a dentist for checkups and cleanings. PREVENTION   Practice good oral hygiene.  Consider a dental sealant. A dental sealant is a coating material that is applied by your dentist to the pits and grooves of teeth. The sealant prevents food from being trapped in them. It may protect the teeth for several years.  Ask about fluoride supplements if you live in a community without fluorinated water or with water that has a low fluoride content. Use fluoride supplements  as directed by your dentist or health care provider.  Allow fluoride varnish applications to teeth if directed by your dentist or health care provider.   This information is not intended to replace advice given to you by your health care provider. Make sure you discuss any questions you have with your health care provider.   Document Released: 06/20/2002 Document Revised: 10/19/2014 Document Reviewed: 09/30/2012 Elsevier Interactive Patient Education 2016 Elsevier Inc.  Contusion A contusion is a deep bruise. Contusions happen when an injury causes bleeding under the skin. Symptoms of bruising include pain, swelling, and discolored skin. The skin may turn blue, purple, or yellow. HOME CARE   Rest the injured area.  If told, put ice on the injured area.  Put ice in a plastic bag.  Place a towel between your skin and the bag.  Leave the ice on for 20 minutes, 2-3 times per day.  If told, put light pressure (compression) on the injured area using an elastic bandage. Make sure the bandage is not too tight. Remove it and put it back on as told by your doctor.  If possible, raise (elevate) the injured area above the level of your heart while you are sitting or lying down.  Take over-the-counter and prescription medicines only as told by your doctor. GET HELP IF:  Your symptoms do not get better after several days of treatment.  Your symptoms get worse.  You have trouble moving the injured area. GET HELP RIGHT AWAY IF:   You have very bad pain.  You have a loss of feeling (numbness) in a hand or foot.  Your hand or foot turns pale or cold.   This information is not intended to replace advice given to you by your health care provider. Make sure you discuss any questions you have with your health care provider.   Document Released: 03/16/2008 Document Revised: 06/19/2015 Document Reviewed: 02/13/2015 Elsevier Interactive Patient Education 2016 Elsevier  Inc.  Cryotherapy Cryotherapy is when you put ice on your injury. Ice helps lessen pain and puffiness (swelling) after an injury. Ice works the best when you start using it in the first 24 to 48 hours after an injury. HOME CARE  Put a dry or damp towel between the ice pack and your skin.  You may press gently on the ice pack.  Leave the ice on for no more than 10 to 20 minutes at a time.  Check your skin after 5 minutes to make sure your skin is okay.  Rest at least 20 minutes between ice pack uses.  Stop using ice when your skin loses feeling (numbness).  Do not use ice on someone who cannot tell you when it hurts. This includes small children and people with memory problems (dementia). GET HELP RIGHT AWAY IF:  You have white spots on your skin.  Your skin turns blue or pale.  Your skin feels waxy or hard.  Your puffiness gets worse. MAKE SURE YOU:   Understand these instructions.  Will watch your  condition.  Will get help right away if you are not doing well or get worse.   This information is not intended to replace advice given to you by your health care provider. Make sure you discuss any questions you have with your health care provider.   Document Released: 03/16/2008 Document Revised: 12/21/2011 Document Reviewed: 05/21/2011 Elsevier Interactive Patient Education 2016 [employer].    OPTIONS FOR DENTAL FOLLOW UP CARE  [place] Department of Health and Human Services - Local Safety Net Dental Clinics [email or web address]   [facility] [phone])  [facility] [phone])  [place] [phone])  [facility] [phone])  [facility] [phone]) This clinic caters to the indigent population and is on a lottery system. Location: [employer] [facility], [employer], [address] [place] Clinic Hours: Wednesdays from  6pm - 9pm, patients seen by a lottery system. For dates, call or go to [email or web address] Services: Cleanings, fillings and simple extractions. Payment Options: DENTAL WORK IS FREE OF CHARGE. Bring proof of income or support. Best way to get seen: Arrive at 5:15 pm - this is a lottery, NOT first come/first serve, so arriving earlier will not increase your chances of being seen.     [facility] [phone] Select option 1 for emergencies   Location: [facility], [place], [address], [place] Clinic Hours: No walk-ins accepted - call the day before to schedule an appointment. Check in times are 9:30 am and 1:30 pm. Services: Simple extractions, temporary fillings, pulpectomy/pulp debridement, uncomplicated abscess drainage. Payment Options: PAYMENT IS DUE AT THE TIME OF SERVICE.  Fee is usually $100-200, additional surgical procedures (e.g. abscess drainage) may be extra. Cash, checks, Visa/MasterCard accepted.  Can file Medicaid if patient is covered for dental - patient should call case worker to check. No discount for [facility] patients. Best way to get seen: MUST call the day before and get onto the schedule. Can usually be seen the next 1-2 days. No walk-ins accepted.     [facility] [phone]   Location: [facility], [address] [place] Clinic Hours: M, W, Th, F 8am or 1:30pm, Tues 9a or 1:30 - first come/first served. Services: Simple extractions, temporary fillings, uncomplicated abscess drainage.  You do not need to be an [facility] resident. Payment Options: PAYMENT IS DUE AT THE TIME OF SERVICE. Dental insurance, otherwise sliding scale - bring proof of income or support. Depending on income and treatment needed, cost is usually $50-200. Best way to get seen: Arrive early as it is first come/first served.     [facility] [phone]   Location: [address] Clinic Hours: Mon-Thu 8a-5p Services: Most basic dental services including extractions and fillings. Payment Options: PAYMENT IS DUE AT THE TIME OF SERVICE. Sliding scale, up to 50% off - bring proof if income or support. Medicaid with dental option accepted. Best way to get seen: Call to schedule an appointment, can usually be seen within 2 weeks OR they will try to see walk-ins - show up at 8a or 2p (you may have to wait).     [facility] [phone] [place] RESIDENTS ONLY   Location: [facility], [address] [place], [place] [address] Clinic Hours: By appointment only. Monday - Thursday 8am-5pm, Friday 8am-12pm Services: Cleanings, fillings, extractions. Payment Options: PAYMENT IS DUE AT THE TIME OF  SERVICE. Cash, Visa or MasterCard. Sliding scale - $30 minimum per service. Best way to get seen: Come in to office, complete packet and make an appointment - need proof of income or support monies for each household member and proof of [facility] residence. Usually takes about a month to get in.     [facility] [phone]   Location: [address], [facility] Clinic Hours: Walk-in Urgent Care Dental Services are offered Monday-Friday mornings only. The numbers of emergencies accepted daily is limited to the number of providers available. Maximum 15 - Mondays, Wednesdays & Thursdays Maximum 10 - Tuesdays & Fridays Services: You do not need to be a [facility] resident to be seen for a dental emergency. Emergencies are defined as pain, swelling, abnormal bleeding, or dental trauma. Walkins will receive x-rays if needed. NOTE: Dental cleaning is not an emergency. Payment Options: PAYMENT IS DUE AT THE TIME OF SERVICE. Minimum co-pay is $40.00 for uninsured patients. Minimum co-pay is $3.00 for Medicaid with dental  coverage. Dental Insurance is accepted and must be presented at time of visit. Medicare does not cover dental. Forms of payment: Cash, credit card, checks. Best way to get seen: If not previously registered with the clinic, walk-in dental registration begins at 7:15 am and is on a first come/first serve basis. If previously registered with the clinic, call to make an appointment.     [facility] [phone] [place] RESIDENTS ONLY   Location: [address] [place], [place] Clinic Hours: Mon-Thu 10a-2p Services: Extractions only! Payment Options: FREE (donations accepted) - bring proof of income or support Best way to get seen: Call and schedule an appointment OR come at 8am on the 1st Monday of every month (except for holidays) when it is first come/first served.     [facility] [phone]   Location: [address] [place], [place] Clinic Hours: Friday mornings Services, Payment Options, Best way to get seen: Call for info
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
## 2017-03-09 ENCOUNTER — Encounter: Payer: Self-pay | Admitting: Emergency Medicine

## 2017-03-09 ENCOUNTER — Emergency Department: Payer: No Typology Code available for payment source

## 2017-03-09 ENCOUNTER — Emergency Department
Admission: EM | Admit: 2017-03-09 | Discharge: 2017-03-09 | Disposition: A | Discharge status: Home / Self Care | Payer: No Typology Code available for payment source | Attending: Emergency Medicine | Admitting: Emergency Medicine

## 2017-03-09 DIAGNOSIS — R0781 Pleurodynia: Secondary | ICD-10-CM | POA: Insufficient documentation

## 2017-03-09 DIAGNOSIS — F17200 Nicotine dependence, unspecified, uncomplicated: Secondary | ICD-10-CM | POA: Diagnosis not present

## 2017-03-09 DIAGNOSIS — M542 Cervicalgia: Secondary | ICD-10-CM | POA: Diagnosis not present

## 2017-03-09 DIAGNOSIS — Y92410 Unspecified street and highway as the place of occurrence of the external cause: Secondary | ICD-10-CM | POA: Insufficient documentation

## 2017-03-09 DIAGNOSIS — Y999 Unspecified external cause status: Secondary | ICD-10-CM | POA: Diagnosis not present

## 2017-03-09 DIAGNOSIS — Y939 Activity, unspecified: Secondary | ICD-10-CM | POA: Insufficient documentation

## 2017-03-09 DIAGNOSIS — S0990XA Unspecified injury of head, initial encounter: Secondary | ICD-10-CM | POA: Diagnosis present

## 2017-03-09 MED ORDER — CYCLOBENZAPRINE HCL 5 MG PO TABS: 5.0000 mg | ORAL_TABLET | Freq: Three times a day (TID) | ORAL | 0 refills | Status: AC | PRN

## 2017-03-09 MED ORDER — MELOXICAM 7.5 MG PO TABS: 7.5000 mg | ORAL_TABLET | Freq: Every day | ORAL | 1 refills | Status: AC
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
## 2019-03-05 ENCOUNTER — Emergency Department: Payer: BC Managed Care – PPO

## 2019-03-05 ENCOUNTER — Other Ambulatory Visit: Payer: Self-pay

## 2019-03-05 ENCOUNTER — Encounter: Payer: Self-pay | Admitting: Emergency Medicine

## 2019-03-05 ENCOUNTER — Emergency Department
Admission: EM | Admit: 2019-03-05 | Discharge: 2019-03-05 | Disposition: A | Discharge status: Home / Self Care | Payer: BC Managed Care – PPO | Attending: Emergency Medicine | Admitting: Emergency Medicine

## 2019-03-05 DIAGNOSIS — F17210 Nicotine dependence, cigarettes, uncomplicated: Secondary | ICD-10-CM | POA: Insufficient documentation

## 2019-03-05 DIAGNOSIS — Z041 Encounter for examination and observation following transport accident: Secondary | ICD-10-CM | POA: Insufficient documentation

## 2019-03-05 DIAGNOSIS — R4182 Altered mental status, unspecified: Secondary | ICD-10-CM

## 2019-03-05 DIAGNOSIS — F1990 Other psychoactive substance use, unspecified, uncomplicated: Secondary | ICD-10-CM

## 2019-03-05 LAB — CBC WITH DIFFERENTIAL/PLATELET
Abs Immature Granulocytes: 0.01 10*3/uL (ref 0.00–0.07)
Basophils Absolute: 0 10*3/uL (ref 0.0–0.1)
Basophils Relative: 0 %
Eosinophils Absolute: 0.2 10*3/uL (ref 0.0–0.5)
Eosinophils Relative: 3 %
HCT: 37.5 % — ABNORMAL LOW (ref 39.0–52.0)
Hemoglobin: 13.5 g/dL (ref 13.0–17.0)
Immature Granulocytes: 0 %
Lymphocytes Relative: 49 %
Lymphs Abs: 3.4 10*3/uL (ref 0.7–4.0)
MCH: 31 pg (ref 26.0–34.0)
MCHC: 36 g/dL (ref 30.0–36.0)
MCV: 86 fL (ref 80.0–100.0)
Monocytes Absolute: 0.7 10*3/uL (ref 0.1–1.0)
Monocytes Relative: 10 %
Neutro Abs: 2.6 10*3/uL (ref 1.7–7.7)
Neutrophils Relative %: 38 %
Platelets: 200 10*3/uL (ref 150–400)
RBC: 4.36 MIL/uL (ref 4.22–5.81)
RDW: 11.5 % (ref 11.5–15.5)
WBC: 6.9 10*3/uL (ref 4.0–10.5)
nRBC: 0 % (ref 0.0–0.2)

## 2019-03-05 LAB — BASIC METABOLIC PANEL
Anion gap: 9 (ref 5–15)
BUN: 26 mg/dL — ABNORMAL HIGH (ref 6–20)
CO2: 24 mmol/L (ref 22–32)
Calcium: 8.9 mg/dL (ref 8.9–10.3)
Chloride: 104 mmol/L (ref 98–111)
Creatinine, Ser: 1.32 mg/dL — ABNORMAL HIGH (ref 0.61–1.24)
GFR calc Af Amer: >60 mL/min (ref >60)
GFR calc non Af Amer: >60 mL/min (ref >60)
Glucose, Bld: 90 mg/dL (ref 70–99)
Potassium: 3.4 mmol/L — ABNORMAL LOW (ref 3.5–5.1)
Sodium: 137 mmol/L (ref 135–145)

## 2019-03-05 LAB — ETHANOL: Alcohol, Ethyl (B): <10 mg/dL (ref <10)

## 2019-03-05 MED ORDER — SODIUM CHLORIDE 0.9 % IV SOLN: Freq: Once | INTRAVENOUS | Status: DC
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
## 2019-03-05 NOTE — Discharge Instructions (Addendum)
OPTIONS FOR DENTAL FOLLOW UP CARE ° °[place] Department of Health and Human Services - Local Safety Net Dental Clinics °http://www.ncdhhs.gov/dph/oralhealth/services/safetynetclinics.htm °  °[facility] (336-562-3123) ° °[facility] [place] ([phone]) ° °[facility] ([phone] ext [phone]) ° °[facility] ([phone]) ° °[facility] ([phone]) °This clinic caters to the indigent population and is on a lottery system. °Location: °[facility], [address], [place] °Clinic Hours: °Wednesdays from 6pm - 9pm, patients seen by a lottery system. °For dates, call or go to www.med.[facility].edu/[facility]/patients/Dental-[facility] °Services: °Cleanings, fillings and simple extractions. °Payment Options: °DENTAL WORK IS FREE OF CHARGE. Bring proof of income or support. °Best way to get seen: °Arrive at 5:15 pm - this is a lottery, NOT first come/first serve, so arriving earlier will not increase your chances of being seen. °  °  °[facility] °[phone] °Select option 1 for emergencies °  °Location: °[facility], [address], [place] °Clinic Hours: °No walk-ins accepted - call the day before to schedule an appointment. °Check in times are 9:30 am and 1:30 pm. °Services: °Simple extractions, temporary fillings, pulpectomy/pulp debridement, uncomplicated abscess drainage. °Payment Options: °PAYMENT IS DUE AT THE TIME OF SERVICE.  Fee is usually $100-200, additional surgical procedures (e.g. abscess drainage) may be extra. °Cash, checks, Visa/MasterCard accepted.  Can file Medicaid if patient is covered for dental - patient should call case worker to check. °No discount for [facility] patients. °Best way to get seen: °MUST call the day before and get onto the schedule. Can usually be seen the next 1-2 days. No walk-ins accepted. °  °  °[facility] °[phone] °   °Location: °[facility], [address], [place] °Clinic Hours: °M, W, Th, F 8am or 1:30pm, Tues 9a or 1:30 - first come/first served. °Services: °Simple extractions, temporary fillings, uncomplicated abscess drainage.  You do not need to be an [place] resident. °Payment Options: °PAYMENT IS DUE AT THE TIME OF SERVICE. °Dental insurance, otherwise sliding scale - bring proof of income or support. °Depending on income and treatment needed, cost is usually $50-200. °Best way to get seen: °Arrive early as it is first come/first served. °  °  °[facility] °[phone] °  °Location: °[address] °Clinic Hours: °Mon-Thu 8a-5p °Services: °Most basic dental services including extractions and fillings. °Payment Options: °PAYMENT IS DUE AT THE TIME OF SERVICE. °Sliding scale, up to 50% off - bring proof if income or support. °Medicaid with dental option accepted. °Best way to get seen: °Call to schedule an appointment, can usually be seen within 2 weeks OR they will try to see walk-ins - show up at 8a or 2p (you may have to wait). °  °  °[facility] °919-245-2435 °[place] RESIDENTS ONLY °  °Location: °[facility], [address], [place],  [address] °Clinic Hours: By appointment only. °Monday - Thursday 8am-5pm, Friday 8am-12pm °Services: Cleanings, fillings, extractions. °Payment Options: °PAYMENT IS DUE AT THE TIME OF SERVICE. °Cash, Visa or MasterCard. Sliding scale - $30 minimum per service. °Best way to get seen: °Come in to office, complete packet and make an appointment - need proof of income °or support monies for each household member and proof of [place] residence. °Usually takes about a month to get in. °  °  °[facility] °[phone] °  °Location: °[address]   [place] °Clinic Hours: Walk-in Urgent Care Dental Services are offered Monday-Friday  mornings only. °The numbers of emergencies accepted daily is limited to the number of °providers available. °Maximum 15 - Mondays, Wednesdays & Thursdays °Maximum 10 - Tuesdays & Fridays °Services: °You do not need to be a [place] resident to be seen for a dental emergency. °Emergencies are defined as pain, swelling, abnormal bleeding, or dental trauma. Walkins will receive x-rays if needed. °NOTE: Dental cleaning is not an emergency. °Payment Options: °PAYMENT IS DUE AT THE TIME OF SERVICE. °Minimum co-pay is $40.00 for uninsured patients. °Minimum co-pay is $3.00 for Medicaid with dental coverage. °Dental Insurance is accepted and must be presented at time of visit. °Medicare does not cover dental. °Forms of payment: Cash, credit card, checks. °Best way to get seen: °If not previously registered with the clinic, walk-in dental registration begins at 7:15 am and is on a first come/first serve basis. °If previously registered with the clinic, call to make an appointment. °  °  °[facility] °[phone] °[place] RESIDENTS ONLY °  °Location: °[address], [place], [place] °Clinic Hours: °Mon-Thu 10a-2p °Services: Extractions only! °Payment Options: °FREE (donations accepted) - bring proof of income or support °Best way to get seen: °Call and schedule an appointment OR come at 8am on the 1st Monday of every month (except for holidays) when it is first come/first served. °  °  °[facility] °[phone] °  °Location: °[address], [place] °Clinic Hours: °Friday mornings °Services, Payment Options, Best way to get seen: °Call for info °

## 2019-03-05 NOTE — ED Notes (Signed)
Pt back from ct - was combative during the test. Is sitting cross-legged in the bed refusing to lay back. Will not keep his pulse ox on. Dr. Bevely Palmer aware.

## 2019-03-05 NOTE — ED Notes (Signed)
Pt refusing to give urine sample. States he is takes vicodin and percocet to control his constant pain and doesn't want to be "labeled". Pt given phone to call for ride. Iv d/c'd.
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
## 2022-10-12 DIAGNOSIS — Z419 Encounter for procedure for purposes other than remedying health state, unspecified: Secondary | ICD-10-CM | POA: Diagnosis not present

## 2022-11-12 DIAGNOSIS — Z419 Encounter for procedure for purposes other than remedying health state, unspecified: Secondary | ICD-10-CM | POA: Diagnosis not present

## 2022-12-11 DIAGNOSIS — Z419 Encounter for procedure for purposes other than remedying health state, unspecified: Secondary | ICD-10-CM | POA: Diagnosis not present

## 2023-01-11 DIAGNOSIS — Z419 Encounter for procedure for purposes other than remedying health state, unspecified: Secondary | ICD-10-CM | POA: Diagnosis not present

## 2023-02-10 DIAGNOSIS — Z419 Encounter for procedure for purposes other than remedying health state, unspecified: Secondary | ICD-10-CM | POA: Diagnosis not present

## 2023-03-13 DIAGNOSIS — Z419 Encounter for procedure for purposes other than remedying health state, unspecified: Secondary | ICD-10-CM | POA: Diagnosis not present

## 2023-04-12 DIAGNOSIS — Z419 Encounter for procedure for purposes other than remedying health state, unspecified: Secondary | ICD-10-CM | POA: Diagnosis not present

## 2023-05-13 DIAGNOSIS — Z419 Encounter for procedure for purposes other than remedying health state, unspecified: Secondary | ICD-10-CM | POA: Diagnosis not present

## 2023-06-13 DIAGNOSIS — Z419 Encounter for procedure for purposes other than remedying health state, unspecified: Secondary | ICD-10-CM | POA: Diagnosis not present
